# Patient Record
Sex: Male | Born: 1955 | Race: White | Hispanic: No | Smoking: Former smoker
Health system: Southern US, Community
[De-identification: ages and names within clinical notes are randomized; demographics above are authoritative.]

## PROBLEM LIST (undated history)

## (undated) DIAGNOSIS — T7840XA Allergy, unspecified, initial encounter: Secondary | ICD-10-CM

## (undated) DIAGNOSIS — J45909 Unspecified asthma, uncomplicated: Secondary | ICD-10-CM

## (undated) DIAGNOSIS — G709 Myoneural disorder, unspecified: Secondary | ICD-10-CM

## (undated) DIAGNOSIS — E119 Type 2 diabetes mellitus without complications: Secondary | ICD-10-CM

## (undated) HISTORY — DX: Myoneural disorder, unspecified: G70.9

## (undated) HISTORY — PX: HERNIA REPAIR: SHX51

## (undated) HISTORY — DX: Unspecified asthma, uncomplicated: J45.909

## (undated) HISTORY — DX: Allergy, unspecified, initial encounter: T78.40XA

---

## 2004-12-04 ENCOUNTER — Emergency Department (HOSPITAL_COMMUNITY): Admission: EM | Admit: 2004-12-04 | Discharge: 2004-12-04 | Payer: Self-pay | Admitting: Emergency Medicine

## 2005-06-22 ENCOUNTER — Emergency Department (HOSPITAL_COMMUNITY): Admission: EM | Admit: 2005-06-22 | Discharge: 2005-06-22 | Payer: Self-pay | Admitting: Family Medicine

## 2005-12-08 IMAGING — CR DG CHEST 2V
2 series · 2 of 2 positions shown · non-contrast
Comparison: none

CLINICAL DATA: Cough, epigastric pain.  
 CHEST, TWO VIEWS:
 Heart size is normal.  Mediastinum is unremarkable.  There is mild bronchial thickening.  No focal infiltrate, mass, effusion, or collapse.

[view not recorded (1 of 2)]
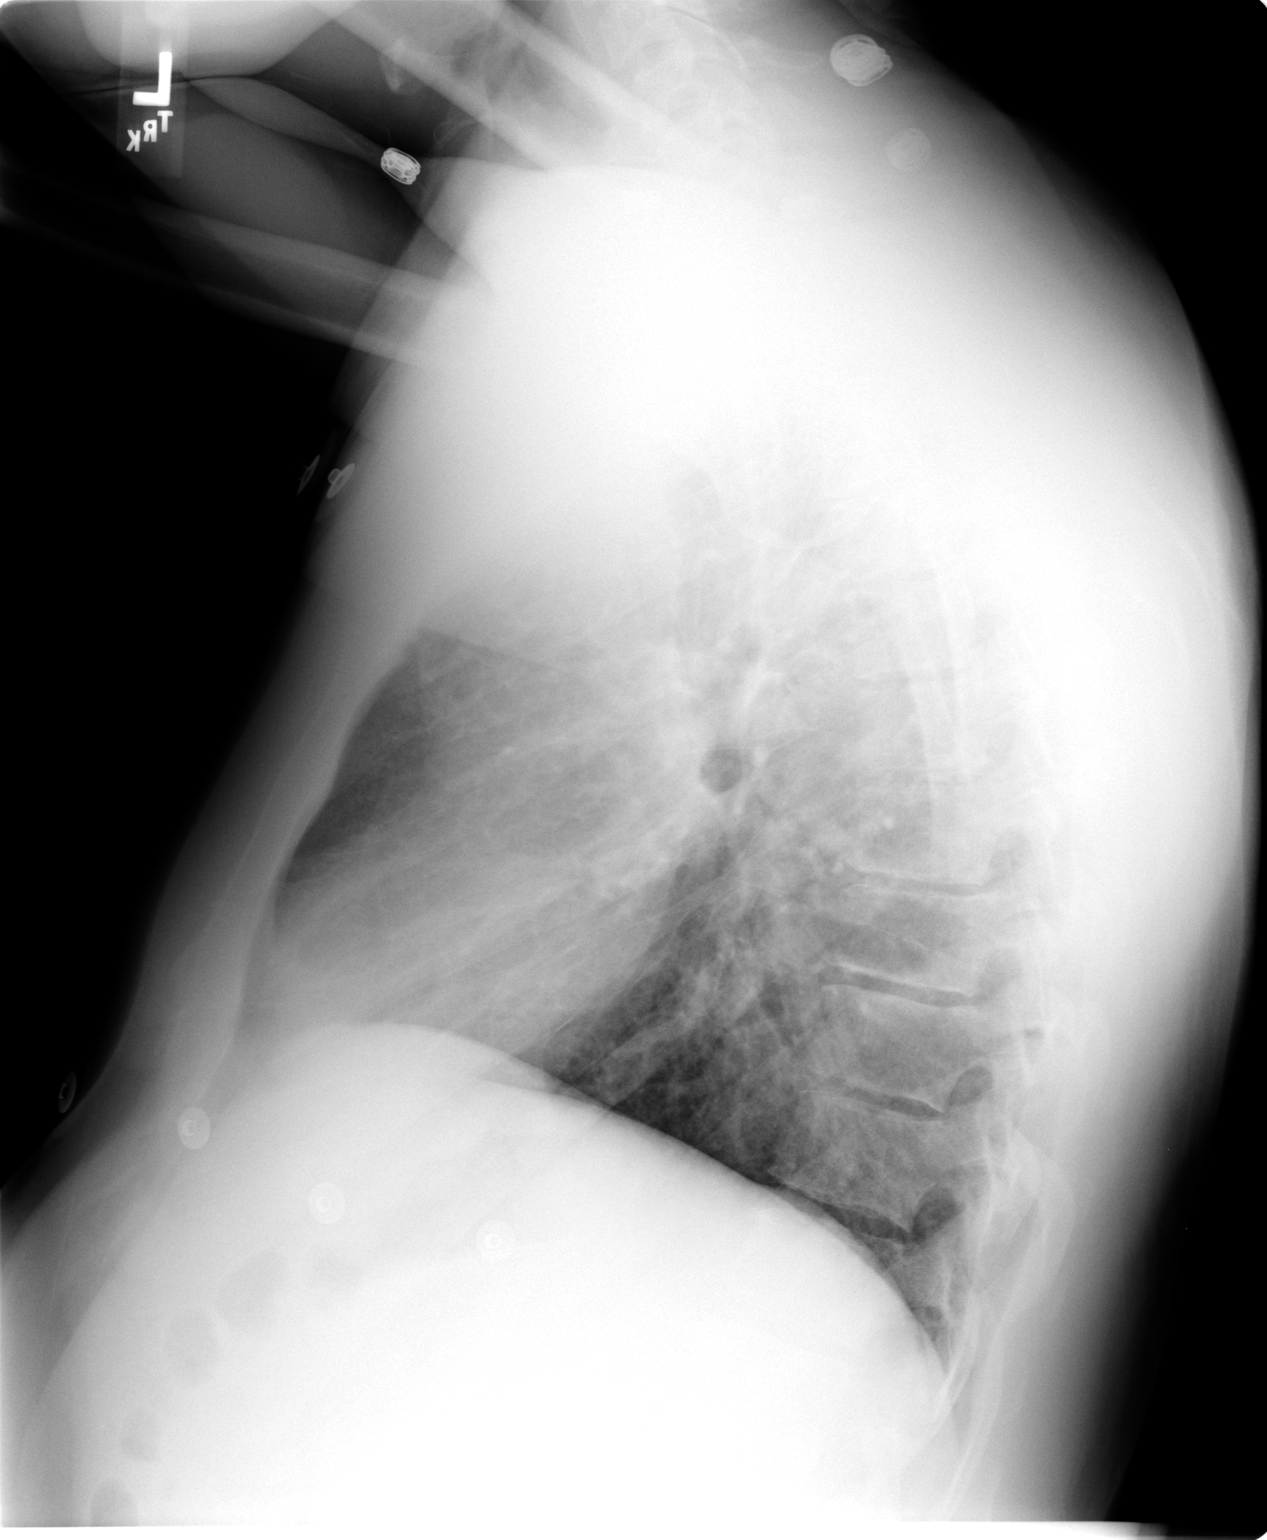

[view not recorded (2 of 2)]
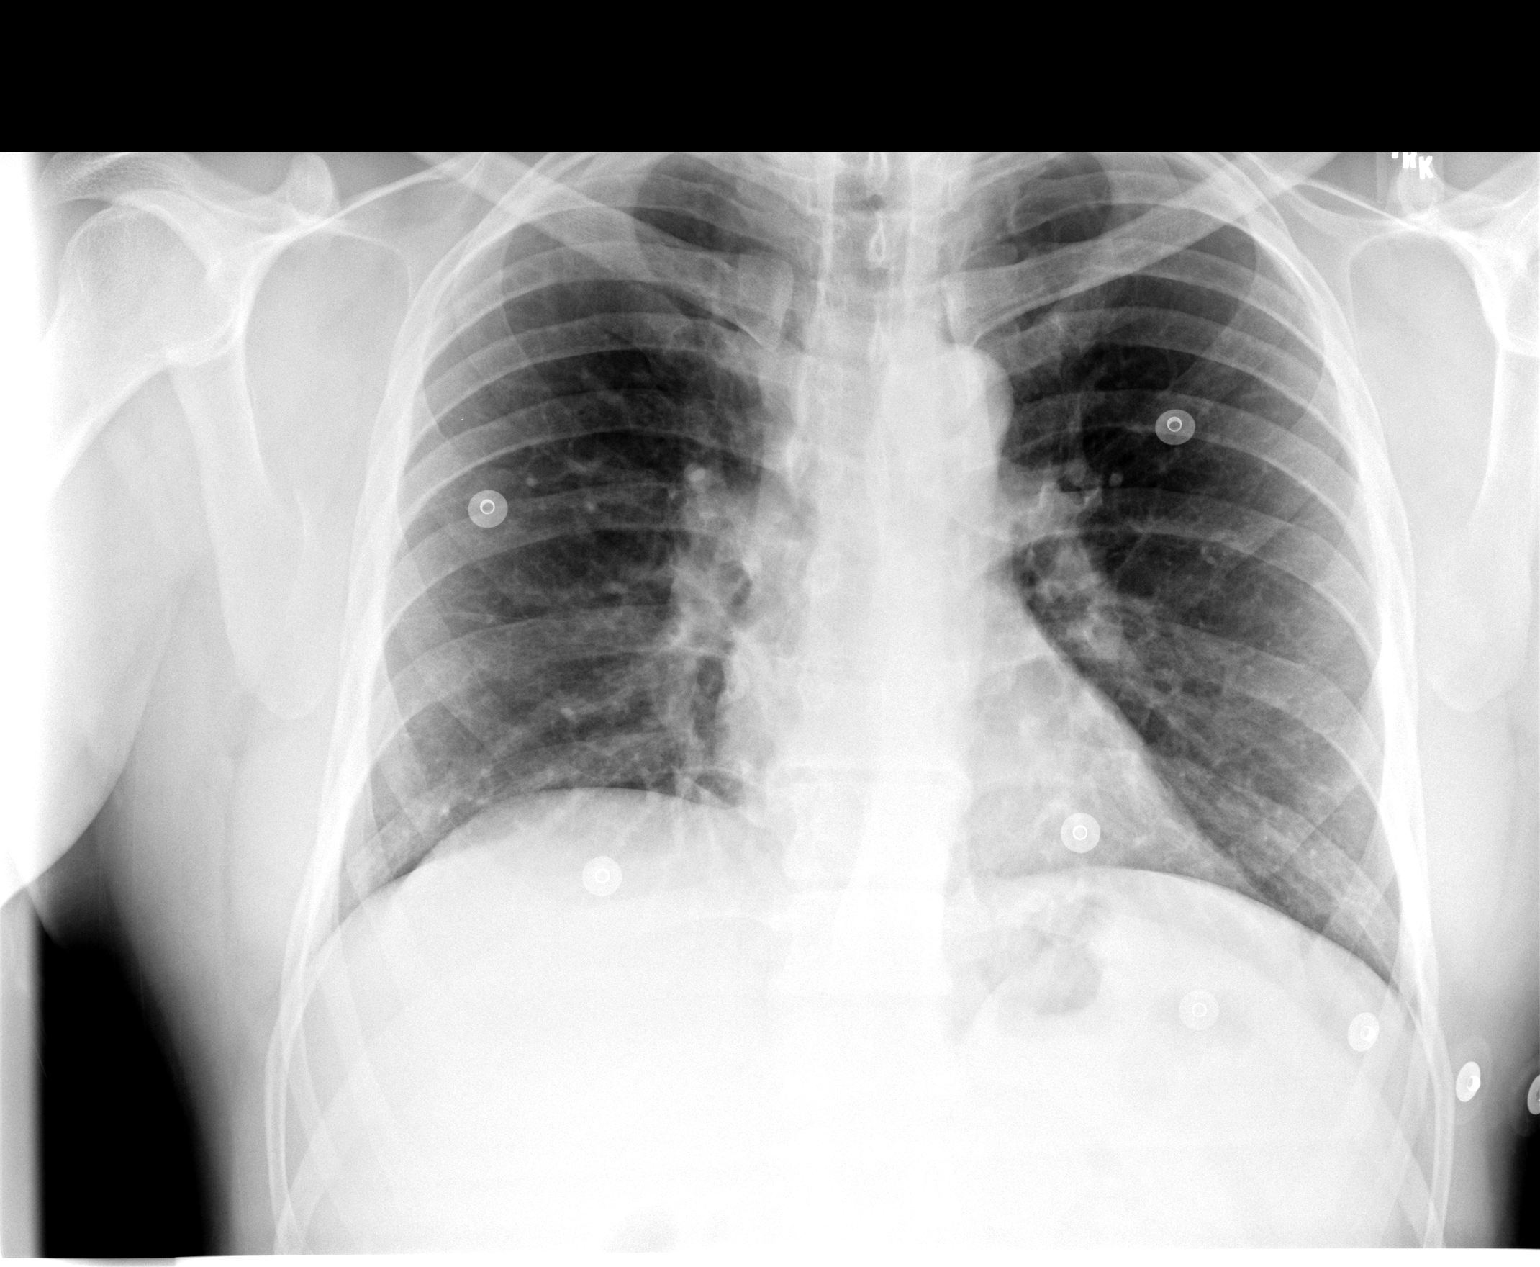

[2 of 2 positions shown; findings below may reference images not displayed]

IMPRESSION: No infiltrate.  Question mild bronchial thickening.

## 2013-04-11 ENCOUNTER — Ambulatory Visit: Payer: Self-pay | Admitting: Family Medicine

## 2013-04-11 ENCOUNTER — Ambulatory Visit: Payer: Self-pay

## 2013-04-11 VITALS — BP 136/74 | HR 91 | Temp 97.9°F | Resp 16 | Ht 65.0 in | Wt 163.8 lb

## 2013-04-11 DIAGNOSIS — R079 Chest pain, unspecified: Secondary | ICD-10-CM

## 2013-04-11 DIAGNOSIS — R42 Dizziness and giddiness: Secondary | ICD-10-CM

## 2013-04-11 DIAGNOSIS — R0989 Other specified symptoms and signs involving the circulatory and respiratory systems: Secondary | ICD-10-CM

## 2013-04-11 DIAGNOSIS — R0600 Dyspnea, unspecified: Secondary | ICD-10-CM

## 2013-04-11 DIAGNOSIS — R0609 Other forms of dyspnea: Secondary | ICD-10-CM

## 2013-04-11 LAB — POCT CBC
Granulocyte percent: 77.5 %G (ref 37–80)
HCT, POC: 44.9 % (ref 43.5–53.7)
Hemoglobin: 14.4 g/dL (ref 14.1–18.1)
Lymph, poc: 2.1 (ref 0.6–3.4)
MCH, POC: 27.6 pg (ref 27–31.2)
MCHC: 32.1 g/dL (ref 31.8–35.4)
MCV: 86.1 fL (ref 80–97)
MID (cbc): 0.8 (ref 0–0.9)
MPV: 8.8 fL (ref 0–99.8)
POC Granulocyte: 10 — AB (ref 2–6.9)
POC LYMPH PERCENT: 16.5 %L (ref 10–50)
POC MID %: 6 %M (ref 0–12)
Platelet Count, POC: 275 10*3/uL (ref 142–424)
RBC: 5.22 M/uL (ref 4.69–6.13)
RDW, POC: 14.8 %
WBC: 12.9 10*3/uL — AB (ref 4.6–10.2)

## 2013-04-11 LAB — EKG 12-LEAD

## 2013-04-11 LAB — GLUCOSE, POCT (MANUAL RESULT ENTRY): POC Glucose: 71 mg/dl (ref 70–99)

## 2013-04-11 LAB — POCT GLYCOSYLATED HEMOGLOBIN (HGB A1C): Hemoglobin A1C: 5.7
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
## 2013-04-11 NOTE — Patient Instructions (Signed)
Please return for any new symptoms or go directly to the emergency room with increasing shortness of breath.  Stop the glipizide  Take the inhaler 3 times a day  Follow up with the [place]

## 2014-04-15 IMAGING — CR DG CHEST 2V
2 series · 2 of 2 positions shown · non-contrast
Comparison: None.

CLINICAL DATA: Chest pain, dizziness

CHEST - 2 VIEW

[PA]
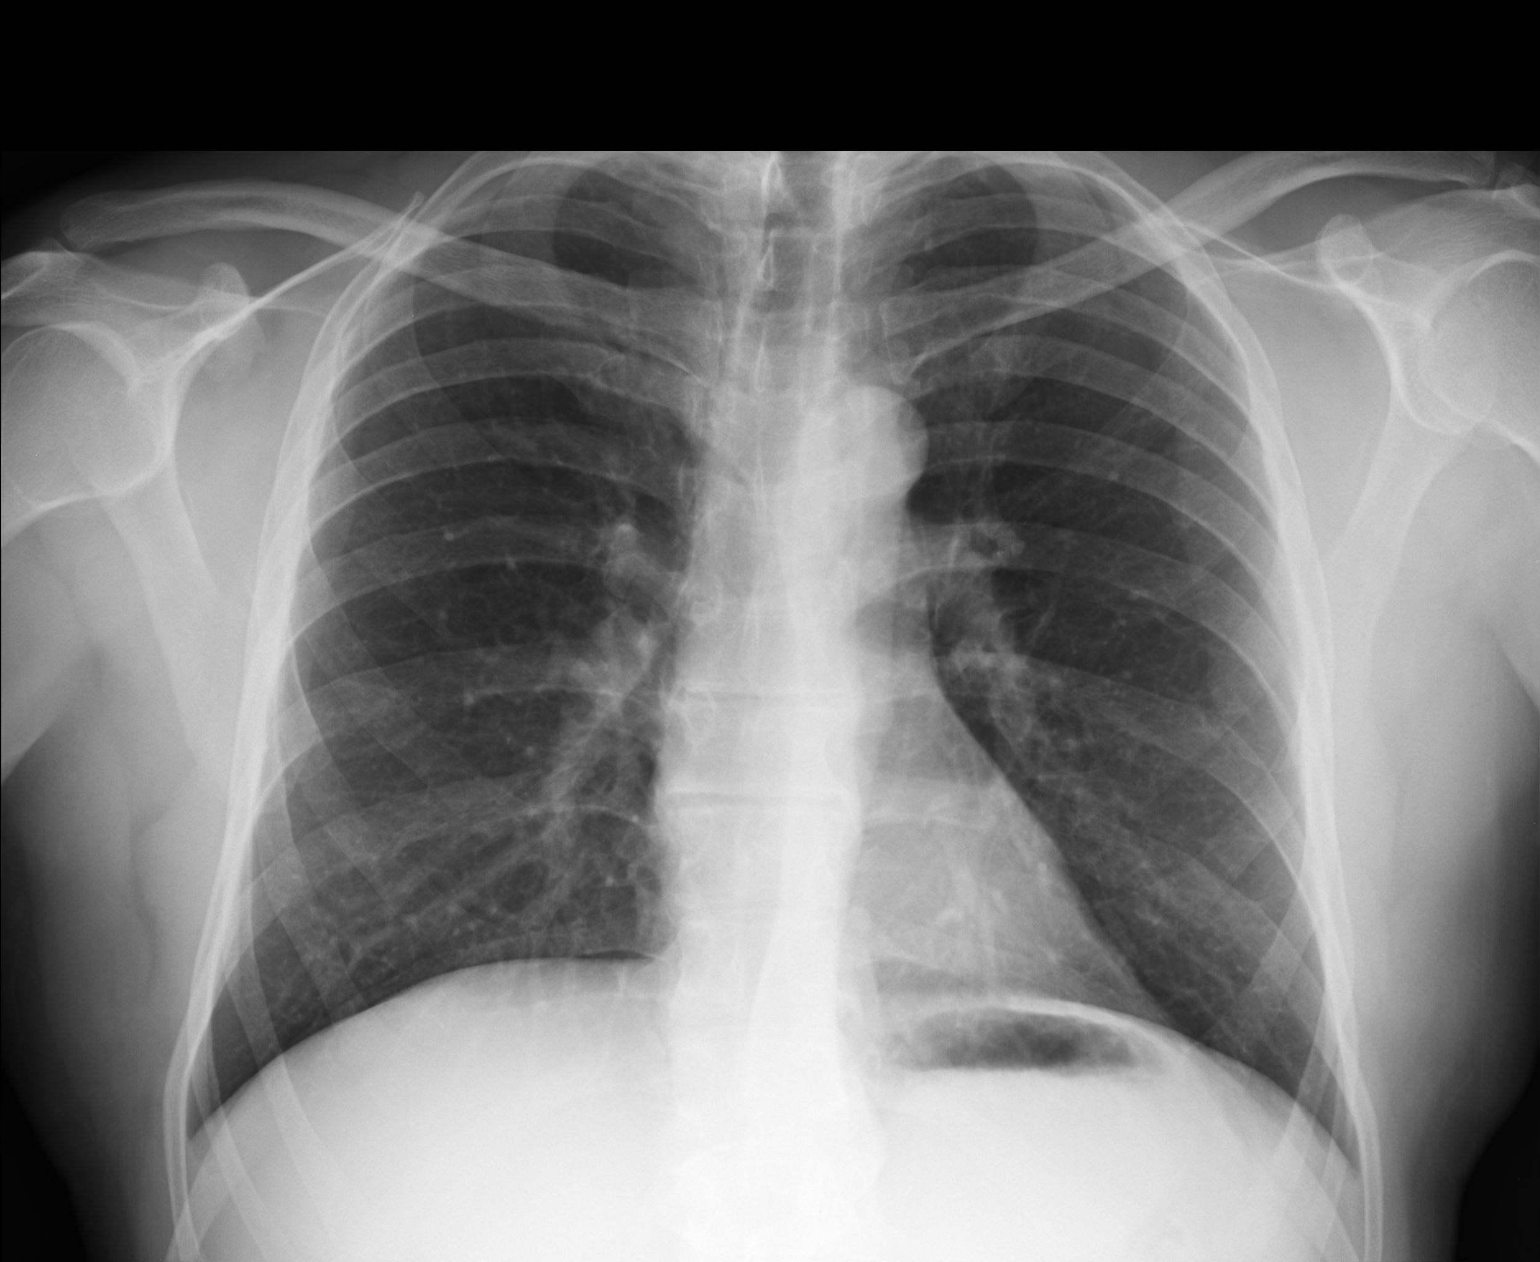

[lateral]
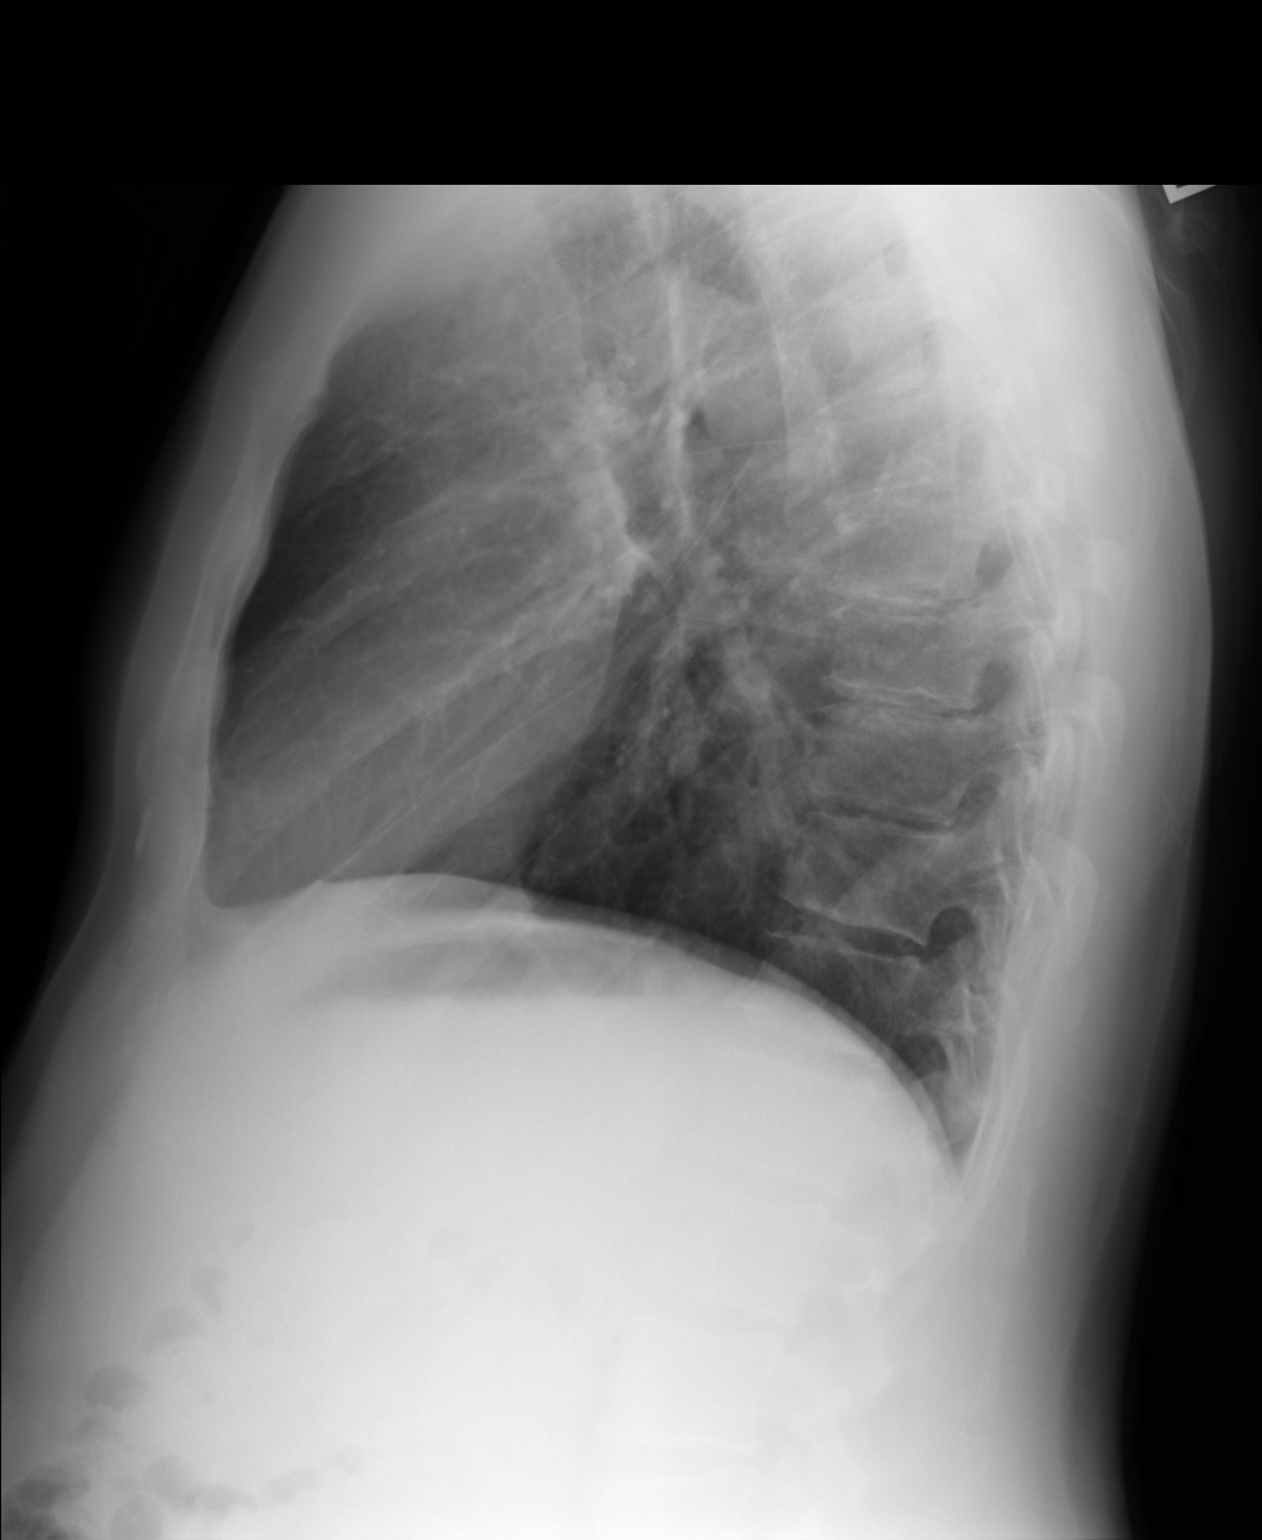

[2 of 2 positions shown; findings below may reference images not displayed]

FINDINGS: Normal cardiac silhouette.  There is mild tortuosity the thoracic
aorta.  The lungs appear mildly hyperexpanded with flattening of
the bilateral hemidiaphragms.  No focal airspace opacity.  No
pleural effusion or pneumothorax.  No definite evidence of edema.
No acute osseous abnormalities.
IMPRESSION: Mild lung hyperexpansion without acute cardiopulmonary disease.

## 2015-04-12 ENCOUNTER — Ambulatory Visit (INDEPENDENT_AMBULATORY_CARE_PROVIDER_SITE_OTHER): Payer: Self-pay | Admitting: Family Medicine

## 2015-04-12 VITALS — BP 106/64 | HR 81 | Temp 98.2°F | Resp 16 | Ht 64.5 in | Wt 162.4 lb

## 2015-04-12 DIAGNOSIS — Z024 Encounter for examination for driving license: Secondary | ICD-10-CM

## 2015-04-12 DIAGNOSIS — Z021 Encounter for pre-employment examination: Secondary | ICD-10-CM

## 2015-04-12 NOTE — Progress Notes (Signed)
Commercial Driver Medical Examination   Jared Russell Urich is a 59 y.o. male who presents today for a commercial driver fitness determination physical exam. The patient reports no problems. The following portions of the patient's history were reviewed and updated as appropriate: allergies, current medications, past family history, past medical history, past social history, past surgical history and problem list. 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  Past Medical History  Diagnosis Date  . Allergy   . Asthma   . Neuromuscular disorder    Past Surgical History  Procedure Laterality Date  . Hernia repair     Current Outpatient Prescriptions on File Prior to Visit  Medication Sig Dispense Refill  . gabapentin (NEURONTIN) 100 MG capsule Take 100 mg by mouth 2 (two) times daily.    . metFORMIN (GLUCOPHAGE) 500 MG tablet Take 500 mg by mouth 2 (two) times daily with a meal.    . glipiZIDE (GLUCOTROL) 5 MG tablet Take 5 mg by mouth 2 (two) times daily before a meal.    . lisinopril (PRINIVIL,ZESTRIL) 5 MG tablet Take 5 mg by mouth daily.    . simvastatin (ZOCOR) 40 MG tablet Take 40 mg by mouth every evening.     No current facility-administered medications on file prior to visit.   Allergies  Allergen Reactions  . Sulfa Antibiotics     Does not know reaction   No family history on file. History   Social History  . Marital Status: Legally Separated    Spouse Name: N/A  . Number of Children: N/A  . Years of Education: N/A   Social History Main Topics  . Smoking status: Former Games developermoker  . Smokeless tobacco: Former NeurosurgeonUser    Quit date: 04/12/1991  . Alcohol Use: Yes     Comment: occasional  . Drug Use: No  . Sexual Activity: Not Currently   Other Topics Concern  . None   Social History Narrative    Review of Systems A  comprehensive review of systems was negative.   Objective:    Vision:  Applicant can recognize and distinguish among traffic control signals and devices showing standard red, green, and amber colors.    Visual Acuity Screening   Right eye Left eye Both eyes  Without correction: 20/40 20/40 20/40   With correction:     Comments: Color-6/6 85% both eyes  Horizontal field of vision  Monocular Vision?: No  Hearing: Hearing Screening Comments: The patient was able to hear a forced whisper from 10 feet.    BP 106/64 mmHg  Pulse 81  Temp(Src) 98.2 F (36.8 C) (Oral)  Resp 16  Ht 5' 4.5" (1.638 m)  Wt 162 lb 6.4 oz (73.664 kg)  BMI 27.46 kg/m2  SpO2 98%  General Appearance:    Alert, cooperative, no distress, appears stated age  Head:    Normocephalic, without obvious abnormality, atraumatic  Eyes:    PERRL, conjunctiva/corneas clear, EOM's intact, fundi    benign, both eyes       Ears:    Normal TM's and external ear canals, both ears  Nose:   Nares normal, septum midline, mucosa normal, no drainage    or sinus tenderness  Throat:   Lips, mucosa, and tongue normal; teeth and gums normal  Neck:   Supple, symmetrical, trachea midline,  no adenopathy;       thyroid:  No enlargement/tenderness/nodules; no carotid   bruit or JVD  Back:     Symmetric, no curvature, ROM normal, no CVA tenderness  Lungs:     Clear to auscultation bilaterally, respirations unlabored  Chest wall:    No tenderness or deformity  Heart:    Regular rate and rhythm, S1 and S2 normal, no murmur, rub   or gallop  Abdomen:     Soft, non-tender, bowel sounds active all four quadrants,    no masses, no organomegaly  Genitalia:   + inguinal hernia on right, s/p repair on Lt  Rectal:    Extremities:   Extremities normal, atraumatic, no cyanosis or edema  Pulses:   2+ and symmetric all extremities  Skin:   Skin color, texture, turgor normal, no rashes or lesions  Lymph nodes:   Cervical, supraclavicular, and  axillary nodes normal  Neurologic:   CNII-XII intact. Normal strength, sensation and reflexes      throughout    Labs: SG 1.015, neg prot, neg gluc, neg blood  Assessment:    Healthy male exam.  Meets standards, but periodic monitoring required due to HTN.  Driver qualified only for 1 year.    Plan:    Medical examiners certificate completed and printed. Return as needed.  Vision borderline - recommend opto visit before next DOT exam.

## 2020-04-19 ENCOUNTER — Ambulatory Visit (INDEPENDENT_AMBULATORY_CARE_PROVIDER_SITE_OTHER): Payer: Non-veteran care | Admitting: Registered Nurse

## 2020-04-19 ENCOUNTER — Other Ambulatory Visit: Payer: Self-pay

## 2020-04-19 ENCOUNTER — Encounter: Payer: Self-pay | Admitting: Registered Nurse

## 2020-04-19 VITALS — BP 124/77 | HR 60 | Temp 97.8°F | Resp 16 | Ht 64.5 in | Wt 159.8 lb

## 2020-04-19 DIAGNOSIS — S0181XA Laceration without foreign body of other part of head, initial encounter: Secondary | ICD-10-CM

## 2020-04-19 MED ORDER — DOXYCYCLINE HYCLATE 100 MG PO TABS: 100.0000 mg | ORAL_TABLET | Freq: Two times a day (BID) | ORAL | 0 refills | Status: AC

## 2020-04-19 NOTE — Patient Instructions (Signed)
° ° ° °  If you have lab work done today you will be contacted with your lab results within the next 2 weeks.  If you have not heard from us then please contact us. The fastest way to get your results is to register for My Chart. ° ° °IF you received an x-ray today, you will receive an invoice from [facility]. Please contact  Radiology at [phone] with questions or concerns regarding your invoice.  ° °IF you received labwork today, you will receive an invoice from [facility]. Please contact [facility] at [phone] with questions or concerns regarding your invoice.  ° °Our billing staff will not be able to assist you with questions regarding bills from these companies. ° °You will be contacted with the lab results as soon as they are available. The fastest way to get your results is to activate your My Chart account. Instructions are located on the last page of this paperwork. If you have not heard from us regarding the results in 2 weeks, please contact this office. °  ° ° ° °

## 2020-06-02 ENCOUNTER — Encounter: Payer: Self-pay | Admitting: Registered Nurse

## 2020-06-02 NOTE — Progress Notes (Signed)
Established Patient Office Visit  Subjective:  Patient ID: Jared Russell, male    DOB: July 13, 1956  Age: 64 y.o. MRN: [id number]  CC:  Chief Complaint  Patient presents with  . Eye Pain    This morning patient was unloading a truck and hit the side of his face on some metal pole. Per patient he has not taken anything for pain anf it will nt stop bleeding. Patient is seeing a occasional blurr in his vision    HPI Jared Russell presents for wound on face.  Was unloading a truck this morning at work when he was struck by an errant piece of metal.  Up to date on tdap per patient. States that orbit is not affected, no visual changes, but would is near eye. Bleeding substantially - unfortunately having trouble getting this under control. No local bruising. Does not suspect any broken bones.  No other concerns at this time.   Past Medical History:  Diagnosis Date  . Allergy   . Asthma   . Neuromuscular disorder [facility])     Past Surgical History:  Procedure Laterality Date  . HERNIA REPAIR      No family history on file.  Social History   Socioeconomic History  . Marital status: Legally Separated    Spouse name: Not on file  . Number of children: Not on file  . Years of education: Not on file  . Highest education level: Not on file  Occupational History  . Not on file  Tobacco Use  . Smoking status: Former Research scientist (life sciences)  . Smokeless tobacco: Former Systems developer    Quit date: 04/12/1991  Substance and Sexual Activity  . Alcohol use: Yes    Comment: occasional  . Drug use: No  . Sexual activity: Not Currently  Other Topics Concern  . Not on file  Social History Narrative  . Not on file   Social Determinants of Health   Financial Resource Strain:   . Difficulty of Paying Living Expenses:   Food Insecurity:   . Worried About Charity fundraiser in the Last Year:   . Arboriculturist in the Last Year:   Transportation Needs:   . Film/video editor (Medical):   Marland Kitchen Lack of  Transportation (Non-Medical):   Physical Activity:   . Days of Exercise per Week:   . Minutes of Exercise per Session:   Stress:   . Feeling of Stress :   Social Connections:   . Frequency of Communication with Friends and Family:   . Frequency of Social Gatherings with Friends and Family:   . Attends Religious Services:   . Active Member of Clubs or Organizations:   . Attends Archivist Meetings:   Marland Kitchen Marital Status:   Intimate Partner Violence:   . Fear of Current or Ex-Partner:   . Emotionally Abused:   Marland Kitchen Physically Abused:   . Sexually Abused:     Outpatient Medications Prior to Visit  Medication Sig Dispense Refill  . lisinopril (PRINIVIL,ZESTRIL) 5 MG tablet Take 5 mg by mouth daily.    . metFORMIN (GLUCOPHAGE) 500 MG tablet Take 500 mg by mouth 2 (two) times daily with a meal.    . simvastatin (ZOCOR) 40 MG tablet Take 40 mg by mouth every evening.    . terazosin (HYTRIN) 5 MG capsule Take 5 mg by mouth at bedtime.    . baclofen (LIORESAL) 10 MG tablet Take 10 mg by mouth 3 (three) times daily.    Marland Kitchen  gabapentin (NEURONTIN) 100 MG capsule Take 100 mg by mouth 2 (two) times daily.    Marland Kitchen glipiZIDE (GLUCOTROL) 5 MG tablet Take 5 mg by mouth 2 (two) times daily before a meal.     No facility-administered medications prior to visit.    Allergies  Allergen Reactions  . Sulfa Antibiotics     Does not know reaction    ROS Review of Systems Per hpi   Objective:    Physical Exam Vitals and nursing note reviewed.  Constitutional:      General: He is not in acute distress.    Appearance: Normal appearance. He is normal weight. He is not ill-appearing, toxic-appearing or diaphoretic.  Cardiovascular:     Rate and Rhythm: Normal rate and regular rhythm.     Pulses: Normal pulses.  Pulmonary:     Effort: Pulmonary effort is normal. No respiratory distress.  Skin:    General: Skin is warm and dry.     Capillary Refill: Capillary refill takes less than 2 seconds.      Coloration: Skin is not jaundiced or pale.     Findings: Lesion present. No bruising, erythema or rash.       Neurological:     General: No focal deficit present.     Mental Status: He is alert and oriented to person, place, and time. Mental status is at baseline.  Psychiatric:        Mood and Affect: Mood normal.        Behavior: Behavior normal.        Thought Content: Thought content normal.        Judgment: Judgment normal.     BP 124/77   Pulse 60   Temp 97.8 F (36.6 C) (Temporal)   Resp 16   Ht 5' 4.5" (1.638 m)   Wt 159 lb 12.8 oz (72.5 kg)   SpO2 99%   BMI 27.01 kg/m  Wt Readings from Last 3 Encounters:  04/19/20 159 lb 12.8 oz (72.5 kg)  04/12/15 162 lb 6.4 oz (73.7 kg)  04/11/13 163 lb 12.8 oz (74.3 kg)     Health Maintenance Due  Topic Date Due  . Hepatitis C Screening  Never done  . COVID-19 Vaccine (1) Never done  . TETANUS/TDAP  Never done  . COLONOSCOPY  Never done  . PNA vac Low Risk Adult (1 of 2 - PCV13) Never done    There are no preventive care reminders to display for this patient.  No results found for: TSH Lab Results  Component Value Date   WBC 12.9 (A) 04/11/2013   HGB 14.4 04/11/2013   HCT 44.9 04/11/2013   MCV 86.1 04/11/2013   No results found for: NA, K, CHLORIDE, CO2, GLUCOSE, BUN, CREATININE, BILITOT, ALKPHOS, AST, ALT, PROT, ALBUMIN, CALCIUM, ANIONGAP, EGFR, GFR No results found for: CHOL No results found for: HDL No results found for: LDLCALC No results found for: TRIG No results found for: CHOLHDL Lab Results  Component Value Date   HGBA1C 5.7 04/11/2013      Assessment & Plan:   Problem List Items Addressed This Visit    None    Visit Diagnoses    Facial laceration, initial encounter    -  Primary   Relevant Medications   doxycycline (VIBRA-TABS) 100 MG tablet      Meds ordered this encounter  Medications  . doxycycline (VIBRA-TABS) 100 MG tablet    Sig: Take 1 tablet (100 mg total) by mouth 2 (two)  times daily.    Dispense:  10 tablet    Refill:  0    Order Specific Question:   Supervising Provider    Answer:   Forrest Moron [id number]    Follow-up: No follow-ups on file.   PLAN  Laceration treated with dermabond.  Doxycycline 166m PO bid for five days for prevention of infection  Return precautions given  Patient encouraged to call clinic with any questions, comments, or concerns.  RMaximiano Coss NP

## 2022-06-28 ENCOUNTER — Other Ambulatory Visit: Payer: Self-pay

## 2022-06-28 ENCOUNTER — Emergency Department (HOSPITAL_BASED_OUTPATIENT_CLINIC_OR_DEPARTMENT_OTHER)
Admission: EM | Admit: 2022-06-28 | Discharge: 2022-06-28 | Disposition: A | Discharge status: Home / Self Care | Payer: Medicare HMO | Attending: Emergency Medicine | Admitting: Emergency Medicine

## 2022-06-28 ENCOUNTER — Emergency Department (HOSPITAL_BASED_OUTPATIENT_CLINIC_OR_DEPARTMENT_OTHER): Payer: Medicare HMO | Admitting: Radiology

## 2022-06-28 ENCOUNTER — Emergency Department (HOSPITAL_BASED_OUTPATIENT_CLINIC_OR_DEPARTMENT_OTHER): Payer: Medicare HMO

## 2022-06-28 ENCOUNTER — Encounter (HOSPITAL_BASED_OUTPATIENT_CLINIC_OR_DEPARTMENT_OTHER): Payer: Self-pay | Admitting: Emergency Medicine

## 2022-06-28 DIAGNOSIS — S29019A Strain of muscle and tendon of unspecified wall of thorax, initial encounter: Secondary | ICD-10-CM

## 2022-06-28 DIAGNOSIS — Y92410 Unspecified street and highway as the place of occurrence of the external cause: Secondary | ICD-10-CM | POA: Diagnosis not present

## 2022-06-28 DIAGNOSIS — S161XXA Strain of muscle, fascia and tendon at neck level, initial encounter: Secondary | ICD-10-CM | POA: Diagnosis not present

## 2022-06-28 DIAGNOSIS — Z79899 Other long term (current) drug therapy: Secondary | ICD-10-CM | POA: Diagnosis not present

## 2022-06-28 DIAGNOSIS — R42 Dizziness and giddiness: Secondary | ICD-10-CM | POA: Diagnosis not present

## 2022-06-28 DIAGNOSIS — S46912A Strain of unspecified muscle, fascia and tendon at shoulder and upper arm level, left arm, initial encounter: Secondary | ICD-10-CM

## 2022-06-28 DIAGNOSIS — S29012A Strain of muscle and tendon of back wall of thorax, initial encounter: Secondary | ICD-10-CM | POA: Insufficient documentation

## 2022-06-28 DIAGNOSIS — Z7984 Long term (current) use of oral hypoglycemic drugs: Secondary | ICD-10-CM | POA: Insufficient documentation

## 2022-06-28 DIAGNOSIS — S4992XA Unspecified injury of left shoulder and upper arm, initial encounter: Secondary | ICD-10-CM | POA: Diagnosis present

## 2022-06-28 MED ORDER — METHOCARBAMOL 750 MG PO TABS: 750.0000 mg | ORAL_TABLET | Freq: Three times a day (TID) | ORAL | 0 refills | Status: AC | PRN

## 2022-06-28 MED ORDER — TRAMADOL HCL 50 MG PO TABS
50.0000 mg | ORAL_TABLET | Freq: Once | ORAL | Status: AC
  Administered 2022-06-28: 50 mg via ORAL
  Filled 2022-06-28: qty 1
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
## 2022-06-28 NOTE — Discharge Instructions (Signed)
It was our pleasure to provide your ER care today - we hope that you feel better.  Take acetaminophen or ibuprofen as need for pain. You may also take robaxin as need for muscle pain/spasm - no driving for the next 6 hours, or when taking as it may cause drowsiness.   Follow up with primary care doctor in 1-2 weeks if symptoms fail to improve/resolve.  Return to ER if worse, new symptoms, new/severe pain, or other concern.
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
## 2023-05-19 ENCOUNTER — Ambulatory Visit
Admission: EM | Admit: 2023-05-19 | Discharge: 2023-05-19 | Disposition: A | Discharge status: Home / Self Care | Payer: No Typology Code available for payment source

## 2023-05-19 ENCOUNTER — Emergency Department (HOSPITAL_BASED_OUTPATIENT_CLINIC_OR_DEPARTMENT_OTHER): Payer: No Typology Code available for payment source

## 2023-05-19 ENCOUNTER — Emergency Department (HOSPITAL_BASED_OUTPATIENT_CLINIC_OR_DEPARTMENT_OTHER)
Admission: EM | Admit: 2023-05-19 | Discharge: 2023-05-19 | Disposition: A | Discharge status: Home / Self Care | Payer: No Typology Code available for payment source | Attending: Emergency Medicine | Admitting: Emergency Medicine

## 2023-05-19 DIAGNOSIS — Z7984 Long term (current) use of oral hypoglycemic drugs: Secondary | ICD-10-CM | POA: Insufficient documentation

## 2023-05-19 DIAGNOSIS — Z7982 Long term (current) use of aspirin: Secondary | ICD-10-CM | POA: Diagnosis not present

## 2023-05-19 DIAGNOSIS — J45909 Unspecified asthma, uncomplicated: Secondary | ICD-10-CM | POA: Diagnosis not present

## 2023-05-19 DIAGNOSIS — M7989 Other specified soft tissue disorders: Secondary | ICD-10-CM | POA: Diagnosis not present

## 2023-05-19 DIAGNOSIS — S40021A Contusion of right upper arm, initial encounter: Secondary | ICD-10-CM | POA: Diagnosis not present

## 2023-05-19 DIAGNOSIS — Z79899 Other long term (current) drug therapy: Secondary | ICD-10-CM | POA: Insufficient documentation

## 2023-05-19 DIAGNOSIS — M7981 Nontraumatic hematoma of soft tissue: Secondary | ICD-10-CM | POA: Insufficient documentation

## 2023-05-19 DIAGNOSIS — E119 Type 2 diabetes mellitus without complications: Secondary | ICD-10-CM | POA: Insufficient documentation

## 2023-05-19 DIAGNOSIS — Z794 Long term (current) use of insulin: Secondary | ICD-10-CM | POA: Diagnosis not present

## 2023-05-19 DIAGNOSIS — S5001XA Contusion of right elbow, initial encounter: Secondary | ICD-10-CM

## 2023-05-19 DIAGNOSIS — M25521 Pain in right elbow: Secondary | ICD-10-CM | POA: Diagnosis present

## 2023-05-19 HISTORY — DX: Type 2 diabetes mellitus without complications: E11.9

## 2023-05-19 LAB — CBC WITH DIFFERENTIAL/PLATELET
Abs Immature Granulocytes: 0.06 10*3/uL (ref 0.00–0.07)
Basophils Absolute: 0 10*3/uL (ref 0.0–0.1)
Basophils Relative: 1 %
Eosinophils Absolute: 0.1 10*3/uL (ref 0.0–0.5)
Eosinophils Relative: 2 %
HCT: 40.8 % (ref 39.0–52.0)
Hemoglobin: 13.2 g/dL (ref 13.0–17.0)
Immature Granulocytes: 1 %
Lymphocytes Relative: 25 %
Lymphs Abs: 2.2 10*3/uL (ref 0.7–4.0)
MCH: 25.4 pg — ABNORMAL LOW (ref 26.0–34.0)
MCHC: 32.4 g/dL (ref 30.0–36.0)
MCV: 78.6 fL — ABNORMAL LOW (ref 80.0–100.0)
Monocytes Absolute: 0.7 10*3/uL (ref 0.1–1.0)
Monocytes Relative: 8 %
Neutro Abs: 5.7 10*3/uL (ref 1.7–7.7)
Neutrophils Relative %: 63 %
Platelets: 253 10*3/uL (ref 150–400)
RBC: 5.19 MIL/uL (ref 4.22–5.81)
RDW: 18.3 % — ABNORMAL HIGH (ref 11.5–15.5)
WBC: 8.8 10*3/uL (ref 4.0–10.5)
nRBC: 0 % (ref 0.0–0.2)

## 2023-05-19 LAB — BASIC METABOLIC PANEL
Anion gap: 8 (ref 5–15)
BUN: 16 mg/dL (ref 8–23)
CO2: 27 mmol/L (ref 22–32)
Calcium: 9.7 mg/dL (ref 8.9–10.3)
Chloride: 99 mmol/L (ref 98–111)
Creatinine, Ser: 0.87 mg/dL (ref 0.61–1.24)
GFR, Estimated: >60 mL/min (ref >60)
Glucose, Bld: 97 mg/dL (ref 70–99)
Potassium: 4.3 mmol/L (ref 3.5–5.1)
Sodium: 134 mmol/L — ABNORMAL LOW (ref 135–145)

## 2023-05-19 NOTE — ED Notes (Signed)
Patient is being discharged from the Urgent Care and sent to the Emergency Department via POV . Per Cheri Rous, NP, patient is in need of higher level of care due to possible blood clot. Patient is aware and verbalizes understanding of plan of care.  Vitals:   05/19/23 0815  BP: 137/81  Pulse: 91  Resp: 16  Temp: 99 F (37.2 C)  SpO2: 97%

## 2023-05-19 NOTE — ED Provider Notes (Signed)
[place] EMERGENCY DEPARTMENT AT [facility] Provider Note   CSN: [id number] Arrival date & time: 05/19/23  7829     History  Chief Complaint  Patient presents with   Arm Injury    hematoma    Jared Russell is a 67 y.o. male.  The history is provided by the patient and medical records. No language interpreter was used.  Arm Injury    67 year old male with a known history of diabetes, asthma, neuromuscular disorder, presenting complaining of nontraumatic bruising of his arm.  Patient noticed that yesterday and have reported progressive worsening bruising from his bicep towards his forearm.  Pain is described as an achy sensation worse with palpation and with movement.  He is right-hand dominant.  He denies any chest pain or shortness of breath or fever or chills.  He denies any trauma to the affected area.  Denies any bleeding gum, rectal bleeding or any other abnormal bleeding.  He takes a baby aspirin but not on any blood thinner medication.  He initially reach out to the [place] clinic who recommend patient to be seen and evaluated.  He went to urgent care prior to arrival here who recommend patient to come here to rule out DVT.  No prior history of PE or DVT.  No specific treatment tried at home.  Home Medications Prior to Admission medications   Medication Sig Start Date End Date Taking? Authorizing Provider  amitriptyline (ELAVIL) 25 MG tablet Take by mouth. 04/12/23   [provider]  aspirin EC 81 MG tablet Take by mouth. 08/07/10   [provider]  atorvastatin (LIPITOR) 80 MG tablet Take by mouth. 04/12/23   [provider]  baclofen (LIORESAL) 10 MG tablet Take 10 mg by mouth 3 (three) times daily.    [provider]  diclofenac Sodium (VOLTAREN) 1 % GEL Apply topically. 06/15/22   [provider]  doxycycline (VIBRA-TABS) 100 MG tablet Take 1 tablet (100 mg total) by mouth 2 (two) times daily. 04/19/20   Janeece Agee, NP   empagliflozin (JARDIANCE) 25 MG TABS tablet Take by mouth. 04/25/23   [provider]  finasteride (PROSCAR) 5 MG tablet Take 1 tablet by mouth daily. 04/12/23   [provider]  fluticasone (FLONASE) 50 MCG/ACT nasal spray Place into the nose. 04/12/23   [provider]  gabapentin (NEURONTIN) 100 MG capsule Take 100 mg by mouth 2 (two) times daily.    [provider]  glipiZIDE (GLUCOTROL) 5 MG tablet Take 5 mg by mouth 2 (two) times daily before a meal.    [provider]  insulin glargine-yfgn (SEMGLEE) 100 UNIT/ML Pen Inject into the skin. 04/11/23   [provider]  lisinopril (PRINIVIL,ZESTRIL) 5 MG tablet Take 5 mg by mouth daily.    [provider]  metFORMIN (GLUCOPHAGE) 500 MG tablet Take 500 mg by mouth 2 (two) times daily with a meal.    [provider]  methocarbamol (ROBAXIN) 750 MG tablet Take 1 tablet (750 mg total) by mouth 3 (three) times daily as needed (muscle spasm/pain). 06/28/22   Cathren Laine, MD  sildenafil (VIAGRA) 100 MG tablet Take by mouth. 04/12/23   [provider]  simvastatin (ZOCOR) 40 MG tablet Take 40 mg by mouth every evening.    [provider]  terazosin (HYTRIN) 5 MG capsule Take 5 mg by mouth at bedtime.    [provider]      Allergies    Sulfa antibiotics  Review of Systems   Review of Systems  All other systems reviewed and are negative.   Physical Exam Updated Vital Signs BP 125/89 (BP Location: Left Arm)   Pulse 71   Temp 98.1 F (36.7 C)   Resp 17   SpO2 100%  Physical Exam Vitals and nursing note reviewed.  Constitutional:      General: He is not in acute distress.    Appearance: He is well-developed.  HENT:     Head: Atraumatic.  Eyes:     Conjunctiva/sclera: Conjunctivae normal.  Cardiovascular:     Rate and Rhythm: Normal rate and regular rhythm.     Pulses: Normal pulses.     Heart sounds: Normal heart sounds.  Pulmonary:      Effort: Pulmonary effort is normal.     Breath sounds: Normal breath sounds. No wheezing, rhonchi or rales.  Musculoskeletal:        General: Tenderness (Right arm: Ecchymosis noted to distal bicep-elbow and lateral elbow with normal elbow flexion extension and no deformity noted.  No edema to forearm.  Radial pulse 2+.) present.     Cervical back: Neck supple.  Skin:    Findings: No rash.  Neurological:     Mental Status: He is alert.     ED Results / Procedures / Treatments   Labs (all labs ordered are listed, but only abnormal results are displayed) Labs Reviewed  CBC WITH DIFFERENTIAL/PLATELET - Abnormal; Notable for the following components:      Result Value   MCV 78.6 (*)    MCH 25.4 (*)    RDW 18.3 (*)    All other components within normal limits  BASIC METABOLIC PANEL - Abnormal; Notable for the following components:   Sodium 134 (*)    All other components within normal limits    EKG None  Radiology US Venous Img Upper Right (DVT Study)  Result Date: 05/19/2023 CLINICAL DATA:  Right arm pain, swelling and bruising for 2 days with no known injury. EXAM: RIGHT UPPER EXTREMITY VENOUS DOPPLER ULTRASOUND TECHNIQUE: Gray-scale sonography with graded compression, as well as color Doppler and duplex ultrasound were performed to evaluate the upper extremity deep venous system from the level of the subclavian vein and including the jugular, axillary, basilic, radial, ulnar and upper cephalic vein. Spectral Doppler was utilized to evaluate flow at rest and with distal augmentation maneuvers. COMPARISON:  None Available. FINDINGS: Contralateral Subclavian Vein: Respiratory phasicity is normal and symmetric with the symptomatic side. No evidence of thrombus. Normal compressibility. Internal Jugular Vein: No evidence of thrombus. Normal compressibility, respiratory phasicity and response to augmentation. Subclavian Vein: No evidence of thrombus. Normal compressibility, respiratory  phasicity and response to augmentation. Axillary Vein: No evidence of thrombus. Normal compressibility, respiratory phasicity and response to augmentation. Cephalic Vein: No evidence of thrombus. Normal compressibility, respiratory phasicity and response to augmentation. Basilic Vein: No evidence of thrombus. Normal compressibility, respiratory phasicity and response to augmentation. Brachial Veins: No evidence of thrombus. Normal compressibility, respiratory phasicity and response to augmentation. Radial Veins: No evidence of thrombus. Normal compressibility, respiratory phasicity and response to augmentation. Ulnar Veins: No evidence of thrombus. Normal compressibility, respiratory phasicity and response to augmentation. Venous Reflux:  None visualized. Other Findings: The distal right arm, in the location pain, there is a heterogeneous avascular mass that measures 3.1 x 1.9 x 1.3 cm. This lies within the muscle and is suspected to be a hematoma. IMPRESSION: 1. No evidence of DVT within the right upper extremity.  2. Probable hematoma of the distal right arm, 3.1 cm in size. Recommend follow-up ultrasound in 4-6 weeks to reassess for improvement/resolution. If earlier characterization is desired clinically, this would be best performed with MRI without and with contrast. Electronically Signed   By: Amie Portland M.D.   On: 05/19/2023 11:26    Procedures Procedures    Medications Ordered in ED Medications - No data to display  ED Course/ Medical Decision Making/ A&P                             Medical Decision Making Amount and/or Complexity of Data Reviewed Labs: ordered.   BP 125/89 (BP Location: Left Arm)   Pulse 71   Temp 98.1 F (36.7 C)   Resp 17   SpO2 100%   63:44 AM 67 year old male with a known history of diabetes, asthma, neuromuscular disorder, presenting complaining of nontraumatic bruising of his arm.  Patient noticed that yesterday and have reported progressive worsening  bruising from his bicep towards his forearm.  Pain is described as an achy sensation worse with palpation and with movement.  He is right-hand dominant.  He denies any chest pain or shortness of breath or fever or chills.  He denies any trauma to the affected area.  Denies any bleeding gum, rectal bleeding or any other abnormal bleeding.  He takes a baby aspirin but not on any blood thinner medication.  He initially reach out to the [place] clinic who recommend patient to be seen and evaluated.  He went to urgent care prior to arrival here who recommend patient to come here to rule out DVT.  No prior history of PE or DVT.  No specific treatment tried at home.  On exam this is an elderly male well-appearing resting comfortably in the bed appears to be in no acute discomfort.  On his right arm that is a 4 cm ecchymosis noted to his distal bicep/elbow extending laterally with some tenderness to palpation but no obvious deformity noted.  No significant edema to his forearm of his wrist.  Radial pulse 2+ brisk cap refill to all fingers and sensations intact throughout.  All compartments soft.  -Labs ordered, independently viewed and interpreted by me.  Labs remarkable for reassuring labs -The patient was maintained on a cardiac monitor.  I personally viewed and interpreted the cardiac monitored which showed an underlying rhythm of: NSR -Imaging independently viewed and interpreted by me and I agree with radiologist's interpretation.  Result remarkable for [place] RUE showing no evidence of DVT.  Probable hematoma of the distal right arm 3.1 cm in size.   -This patient presents to the ED for concern of hematoma, this involves an extensive number of treatment options, and is a complaint that carries with it a high risk of complications and morbidity.  The differential diagnosis includes hematoma, dvt, muscle tear, fx, dislocation -Co morbidities that complicate the patient evaluation includes DM -Treatment includes ice  pack -Reevaluation of the patient after these medicines showed that the patient stayed the same -PCP office notes or outside notes reviewed -Discussion with specialist attending Dr. Anitra Lauth -Escalation to admission/observation considered: patients feels much better, is comfortable with discharge, and will follow up with PCP -Prescription medication considered, patient comfortable with ice pack -Social Determinant of Health considered which includes tobacco use         Final Clinical Impression(s) / ED Diagnoses Final diagnoses:  Hematoma of right elbow  Rx / DC Orders ED Discharge Orders     None         Fayrene Helper, PA-C 05/19/23 1316    Gwyneth Sprout, MD 05/22/23 [phone]

## 2023-05-19 NOTE — ED Triage Notes (Signed)
Pt reports small amount of bruising with mild pain to right upper arm that started yesterday. Bruising has spread markedly since yesterday with no known injury.

## 2023-05-19 NOTE — ED Provider Notes (Signed)
[facility]    CSN: [id number] Arrival date & time: 05/19/23  0801      History   Chief Complaint Chief Complaint  Patient presents with   hematoma    HPI Jared Russell is a 67 y.o. male [place] Jansky for evaluation of bruising to his arm.  Patient reports he woke yesterday morning with a small bruise to his right antecubital area.  States since then it is gotten larger and is now extended to his upper arm and forearm.  He denies any injury or known inciting event.  He denies any warmth or redness but does state it feels hard.  He does have pain when he tries to flex his bicep muscle.  He is on baby aspirin daily but otherwise denies any other blood thinning medications.  Denies any other history of blood clotting disorders.  [facility] and [facility] to have it [facility] out within 8 hours to rule out a blood clot.  No other concerns at this time.  HPI  Past Medical History:  Diagnosis Date   Allergy    Asthma    Diabetes (HCC)    Neuromuscular disorder (HCC)     There are no problems to display for this patient.   Past Surgical History:  Procedure Laterality Date   HERNIA REPAIR         Home Medications    Prior to Admission medications   Medication Sig Start Date End Date Taking? Authorizing Provider  amitriptyline (ELAVIL) 25 MG tablet Take by mouth. 04/12/23  Yes [provider]  aspirin EC 81 MG tablet Take by mouth. 08/07/10  Yes [provider]  atorvastatin (LIPITOR) 80 MG tablet Take by mouth. 04/12/23  Yes [provider]  diclofenac Sodium (VOLTAREN) 1 % GEL Apply topically. 06/15/22  Yes [provider]  empagliflozin (JARDIANCE) 25 MG TABS tablet Take by mouth. 04/25/23  Yes [provider]  finasteride (PROSCAR) 5 MG tablet Take 1 tablet by mouth daily. 04/12/23  Yes [provider]  fluticasone (FLONASE) 50 MCG/ACT nasal spray Place into the nose. 04/12/23  Yes [provider]  insulin  glargine-yfgn (SEMGLEE) 100 UNIT/ML Pen Inject into the skin. 04/11/23  Yes [provider]  sildenafil (VIAGRA) 100 MG tablet Take by mouth. 04/12/23  Yes [provider]  baclofen (LIORESAL) 10 MG tablet Take 10 mg by mouth 3 (three) times daily.    [provider]  doxycycline (VIBRA-TABS) 100 MG tablet Take 1 tablet (100 mg total) by mouth 2 (two) times daily. 04/19/20   Janeece Agee, NP  gabapentin (NEURONTIN) 100 MG capsule Take 100 mg by mouth 2 (two) times daily.    [provider]  glipiZIDE (GLUCOTROL) 5 MG tablet Take 5 mg by mouth 2 (two) times daily before a meal.    [provider]  lisinopril (PRINIVIL,ZESTRIL) 5 MG tablet Take 5 mg by mouth daily.    [provider]  metFORMIN (GLUCOPHAGE) 500 MG tablet Take 500 mg by mouth 2 (two) times daily with a meal.    [provider]  methocarbamol (ROBAXIN) 750 MG tablet Take 1 tablet (750 mg total) by mouth 3 (three) times daily as needed (muscle spasm/pain). 06/28/22   Cathren Laine, MD  simvastatin (ZOCOR) 40 MG tablet Take 40 mg by mouth every evening.    [provider]  terazosin (HYTRIN) 5 MG capsule Take 5 mg by mouth at bedtime.    [provider]  Family History History reviewed. No pertinent family history.  Social History Social History   Tobacco Use   Smoking status: Former   Smokeless tobacco: Former    Quit date: 04/12/1991  Substance Use Topics   Alcohol use: Yes    Comment: occasional   Drug use: No     Allergies   Sulfa antibiotics   Review of Systems Review of Systems  Skin:        Contusion to right arm     Physical Exam Triage Vital Signs ED Triage Vitals  Enc Vitals Group     BP 05/19/23 0815 137/81     Pulse Rate 05/19/23 0815 91     Resp 05/19/23 0815 16     Temp 05/19/23 0815 99 F (37.2 C)     Temp Source 05/19/23 0815 Oral     SpO2 05/19/23 0815 97 %     Weight --      Height --      Head  Circumference --      Peak Flow --      Pain Score 05/19/23 0817 2     Pain Loc --      Pain Edu? --      Excl. in GC? --    No data found.  Updated Vital Signs BP 137/81 (BP Location: Left Arm)   Pulse 91   Temp 99 F (37.2 C) (Oral)   Resp 16   SpO2 97%   Visual Acuity Right Eye Distance:   Left Eye Distance:   Bilateral Distance:    Right Eye Near:   Left Eye Near:    Bilateral Near:     Physical Exam Vitals and nursing note reviewed.  Constitutional:      General: He is not in acute distress.    Appearance: Normal appearance. He is not ill-appearing.  HENT:     Head: Normocephalic and atraumatic.  Eyes:     Pupils: Pupils are equal, round, and reactive to light.  Cardiovascular:     Rate and Rhythm: Normal rate.  Pulmonary:     Effort: Pulmonary effort is normal.  Skin:    General: Skin is warm and dry.     Findings: Bruising and ecchymosis present. No erythema or wound.          Comments: There is moderate swelling of the right distal upper arm with ecchymosis.  Moderately tender to palpation.  No warmth or erythema.  No Popeye arm.  Brachial pulse +2.  Neurological:     General: No focal deficit present.     Mental Status: He is alert and oriented to person, place, and time.  Psychiatric:        Mood and Affect: Mood normal.        Behavior: Behavior normal.      UC Treatments / Results  Labs (all labs ordered are listed, but only abnormal results are displayed) Labs Reviewed - No data to display  EKG   Radiology No results found.  Procedures Procedures (including critical care time)  Medications Ordered in UC Medications - No data to display  Initial Impression / Assessment and Plan / UC Course  I have reviewed the triage vital signs and the nursing notes.  Pertinent labs & imaging results that were available during my care of the patient were reviewed by me and considered in my medical decision making (see chart for details).      Reviewed exam and symptoms with patient.  Discussed limitations and abilities of urgent care.  Patient's primary concern is  a blood clot.  Advised I am unable to rule that out in the setting.  Given patient's concern [facility] for further workup of his symptoms.  Patient is in agreement will go POV to the ER. Final Clinical Impressions(s) / UC Diagnoses   Final diagnoses:  Contusion of right upper arm, initial encounter  Swelling of upper arm     Discharge Instructions      Please go to the ER for further workup of your symptoms    ED Prescriptions   None    PDMP not reviewed this encounter.   Radford Pax, NP 05/19/23 [phone]

## 2023-05-19 NOTE — ED Triage Notes (Signed)
Pt presents to UC w/ c/o painful right arm bruise since yestrday morning. Swelling present. Pt denies taking a blood thinner or having direct injury to arm.

## 2023-05-19 NOTE — Discharge Instructions (Addendum)
You have been evaluated for your symptoms.  You have a hematoma which is a large bruise to your right upper arm/elbow.  Please apply ice ice pack to affected area and intermittently for the next several days to aid with healing.  It would be beneficial to have a repeat ultrasound in 4 to 6 weeks to ensure improvement or resolution of this condition.  Avoid heavy lifting no more than 5 pounds using right arm.  You may take over-the-counter Tylenol as needed for aches and pain.

## 2023-05-19 NOTE — Discharge Instructions (Signed)
Please go to the ER for further workup of your symptoms  

## 2024-07-03 ENCOUNTER — Encounter: Payer: Self-pay | Admitting: Advanced Practice Midwife
